# Patient Record
Sex: Male | Born: 1980 | Race: White | Hispanic: No | Marital: Single | Smoking: Never smoker
Health system: Southern US, Community
[De-identification: ages and names within clinical notes are randomized; demographics above are authoritative.]

## PROBLEM LIST (undated history)

## (undated) DIAGNOSIS — M479 Spondylosis, unspecified: Secondary | ICD-10-CM

## (undated) DIAGNOSIS — S348XXA Injury of other nerves at abdomen, lower back and pelvis level, initial encounter: Secondary | ICD-10-CM

## (undated) DIAGNOSIS — F329 Major depressive disorder, single episode, unspecified: Secondary | ICD-10-CM

## (undated) DIAGNOSIS — F32A Depression, unspecified: Secondary | ICD-10-CM

## (undated) DIAGNOSIS — Z4542 Encounter for adjustment and management of neuropacemaker (brain) (peripheral nerve) (spinal cord): Secondary | ICD-10-CM

## (undated) DIAGNOSIS — I10 Essential (primary) hypertension: Secondary | ICD-10-CM

## (undated) DIAGNOSIS — S349XXA Injury of unspecified nerves at abdomen, lower back and pelvis level, initial encounter: Secondary | ICD-10-CM

## (undated) HISTORY — PX: LUMBAR MICRODISCECTOMY: SHX99

---
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
## 2010-04-22 HISTORY — PX: SPINAL CORD STIMULATOR INSERTION: SHX5378

## 2014-12-19 ENCOUNTER — Other Ambulatory Visit (HOSPITAL_COMMUNITY): Payer: Self-pay | Admitting: Neurological Surgery

## 2014-12-19 DIAGNOSIS — M545 Low back pain, unspecified: Secondary | ICD-10-CM

## 2014-12-20 ENCOUNTER — Other Ambulatory Visit: Payer: Self-pay | Admitting: Neurological Surgery

## 2015-01-05 ENCOUNTER — Ambulatory Visit (HOSPITAL_COMMUNITY)
Admission: RE | Admit: 2015-01-05 | Discharge: 2015-01-05 | Disposition: A | Discharge status: Home / Self Care | Payer: Managed Care, Other (non HMO) | Source: Ambulatory Visit | Attending: Neurological Surgery | Admitting: Neurological Surgery

## 2015-01-05 DIAGNOSIS — M4806 Spinal stenosis, lumbar region: Secondary | ICD-10-CM | POA: Insufficient documentation

## 2015-01-05 DIAGNOSIS — M47896 Other spondylosis, lumbar region: Secondary | ICD-10-CM | POA: Diagnosis not present

## 2015-01-05 DIAGNOSIS — M545 Low back pain, unspecified: Secondary | ICD-10-CM

## 2015-01-05 DIAGNOSIS — M79661 Pain in right lower leg: Secondary | ICD-10-CM | POA: Diagnosis not present

## 2015-01-05 DIAGNOSIS — R200 Anesthesia of skin: Secondary | ICD-10-CM | POA: Insufficient documentation

## 2015-01-05 MED ORDER — ONDANSETRON HCL 4 MG/2ML IJ SOLN: 4.0000 mg | Freq: Four times a day (QID) | INTRAMUSCULAR | Status: DC | PRN

## 2015-01-05 MED ORDER — IOHEXOL 180 MG/ML  SOLN
20.0000 mL | Freq: Once | INTRAMUSCULAR | Status: DC | PRN
  Administered 2015-01-05: 16 mL via INTRATHECAL
  Filled 2015-01-05: qty 20

## 2015-01-05 MED ORDER — DIAZEPAM 5 MG PO TABS
10.0000 mg | ORAL_TABLET | Freq: Once | ORAL | Status: AC
  Administered 2015-01-05: 10 mg via ORAL

## 2015-01-05 MED ORDER — DIAZEPAM 5 MG PO TABS
ORAL_TABLET | ORAL | Status: AC
  Filled 2015-01-05: qty 2

## 2015-01-05 MED ORDER — OXYCODONE-ACETAMINOPHEN 5-325 MG PO TABS
1.0000 | ORAL_TABLET | ORAL | Status: DC | PRN
  Administered 2015-01-05: 2 via ORAL
  Filled 2015-01-05 (×2): qty 2

## 2015-01-05 MED ORDER — LIDOCAINE HCL (PF) 1 % IJ SOLN
INTRAMUSCULAR | Status: AC
  Filled 2015-01-05: qty 10

## 2015-01-05 NOTE — Discharge Instructions (Signed)
Myelography, Care After °These instructions give you information on caring for yourself after your procedure. Your doctor may also give you specific instructions. Call your doctor if you have any problems or questions after your procedure. °HOME CARE °· Rest often the first day. °· When you rest, lie flat, with your head slightly raised (elevated). °· Avoid heavy lifting and activity for 48 hours. °· You may take the bandage (dressing) off 1 day after the test. °GET HELP RIGHT AWAY IF:  °· You have a very bad headache. °· You have a fever. °MAKE SURE YOU: °· Understand these instructions. °· Will watch your condition. °· Will get help right away if you are not doing well or get worse. °Document Released: 01/16/2008 Document Revised: 08/23/2013 Document Reviewed: 07/28/2013 °Exit[employer]® Patient Information ©2015 [employer]. This information is not intended to replace advice given to you by your health care provider. Make sure you discuss any questions you have with your health care provider. ° ° °

## 2015-01-05 NOTE — Procedures (Signed)
Mr. for cup check is a 34 year old individual is had several surgeries in the past. He had implantation of spinal cord stimulator elsewhere and has had persistent and worsening pain in his back and down his legs he notes that in the last month he's developed a new pain in the left anterior thigh but he said chronic right lumbar radiculopathy for long period of time because of the presence of the spinal cord stimulator a myelogram is now being performed to further evaluate this process.  Pre op Dx: Lumbar radiculopathy Post op Dx: Lumbar radiculopathy Procedure: Lumbar myelogram Surgeon: Tinna Kolker Puncture level: L2-3 Fluid color: Clear colorless Injection:partially subdural injection with mixed features. Advanced spondylitic changes at L3-4 L4-5 L5-S1. Further evaluation with myelography Findings: 16 mL iohexol 180, further evaluation with myelography

## 2016-10-16 IMAGING — CT CT L SPINE W/ CM
3 of 13 series · 8 of 33 positions shown, 9 images · non-contrast
Comparison: None

CLINICAL DATA: Low back pain that is throbbing. Right posterior
calf pain and lateral numbness. Left anterior thigh pain.

EXAM:
LUMBAR MYELOGRAM
FLUOROSCOPY TIME:  1 minutes 18 seconds
PROCEDURE:
Lumbar puncture and intrathecal contrast administration were
performed by Dr. Maloney who will separately report for the portion
of the procedure. I personally supervised acquisition of the
myelogram images.
TECHNIQUE: Contiguous axial images were obtained through the Lumbar spine after
the intrathecal infusion of infusion. Coronal and sagittal
reconstructions were obtained of the axial image sets.

[Series 4: l spine 2.0 i30s 3 · axial · 0.30mm/px · z∈[-293,-113]mm · 2 of 180 slices shown, 3 images]
[im 45/180  soft-tissue]
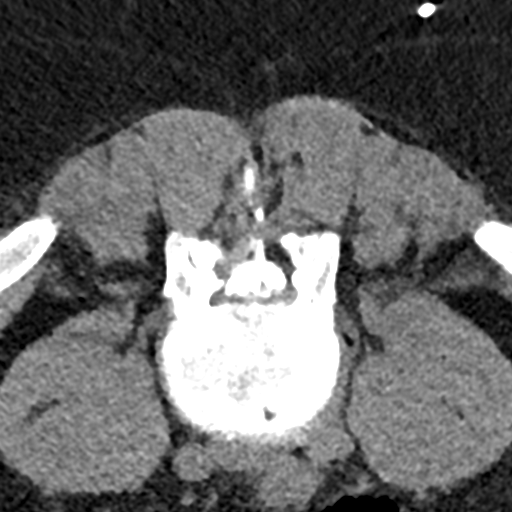
[im 45/180  bone]
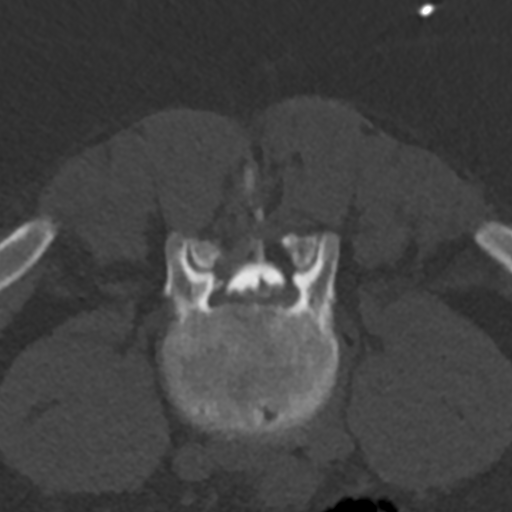
[im 135/180  bone]
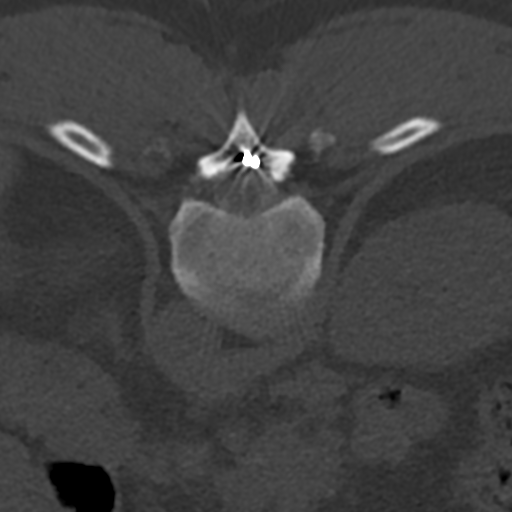

[Series 5: coronal bone · coronal · 0.27mm/px · 1 of 68 slices shown]
[im 34/68  bone]
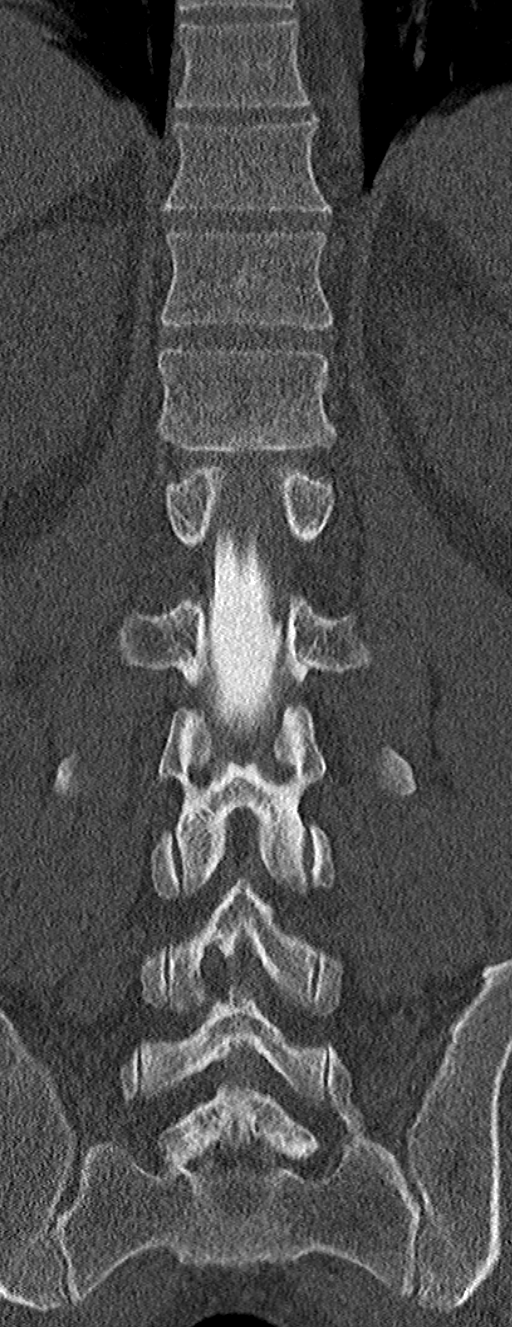

[Series 8: sagittal st · sagittal · 0.28mm/px · 5 of 61 slices shown]
[im 21/61  bone]
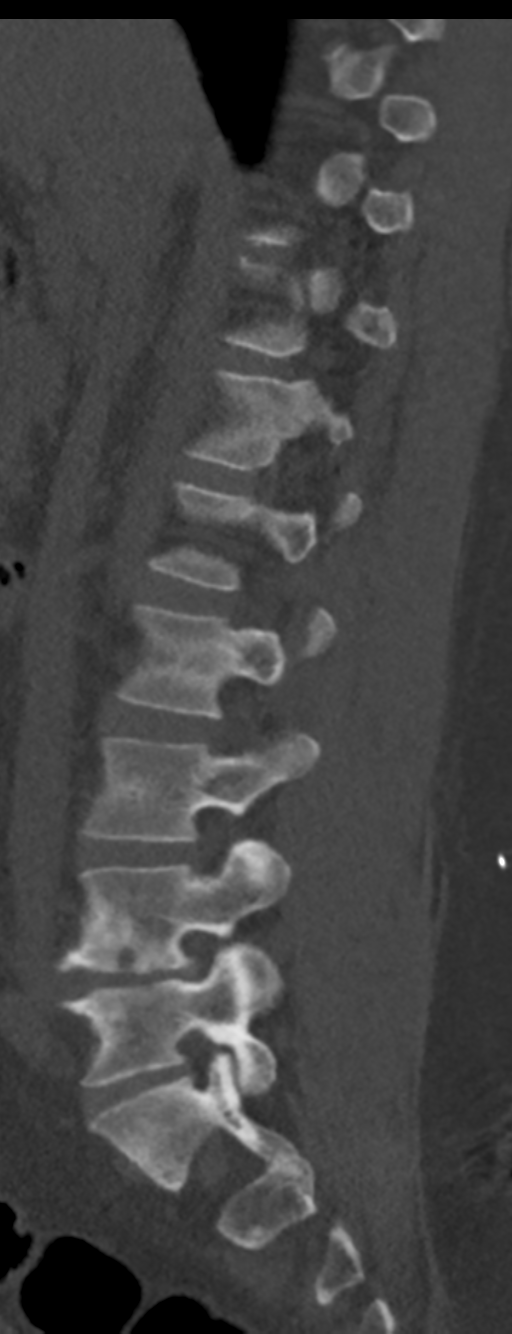
[im 26/61  bone]
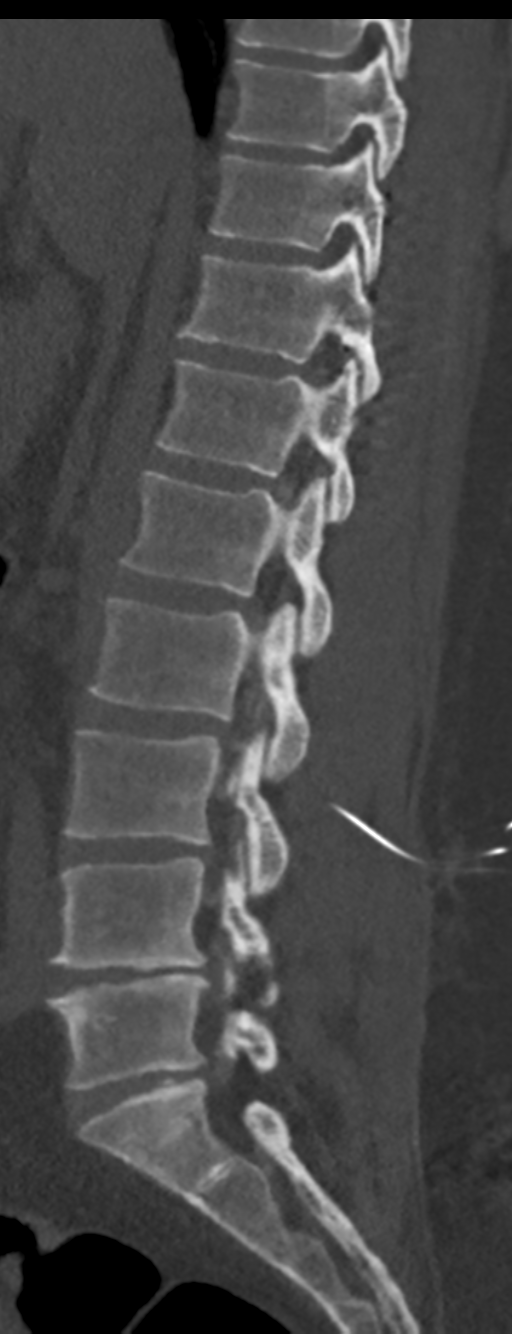
[im 31/61  bone]
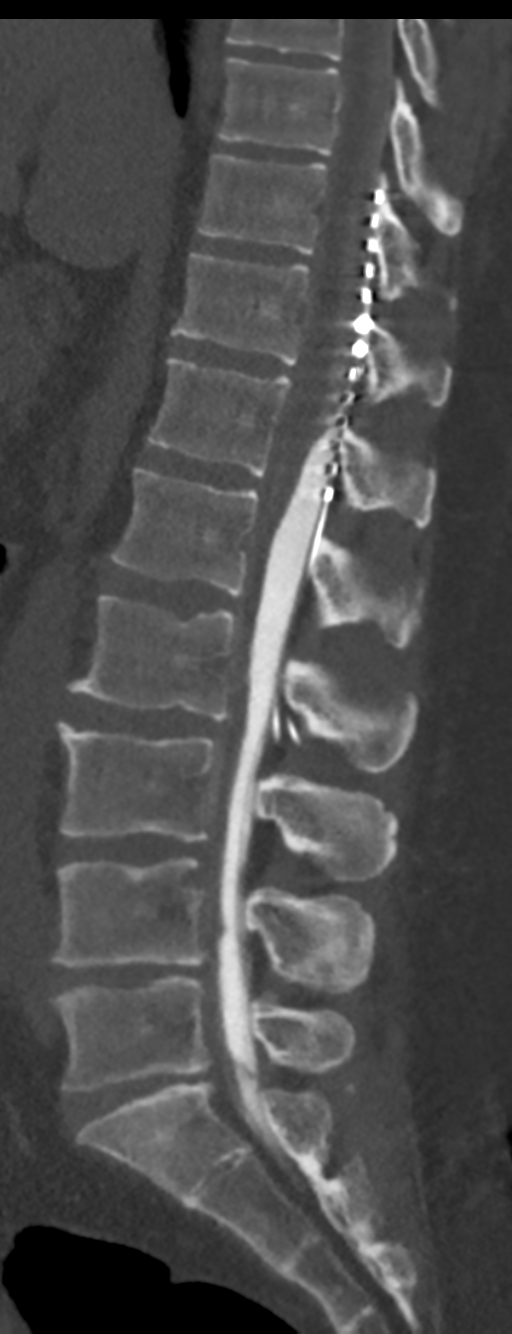
[im 36/61  bone]
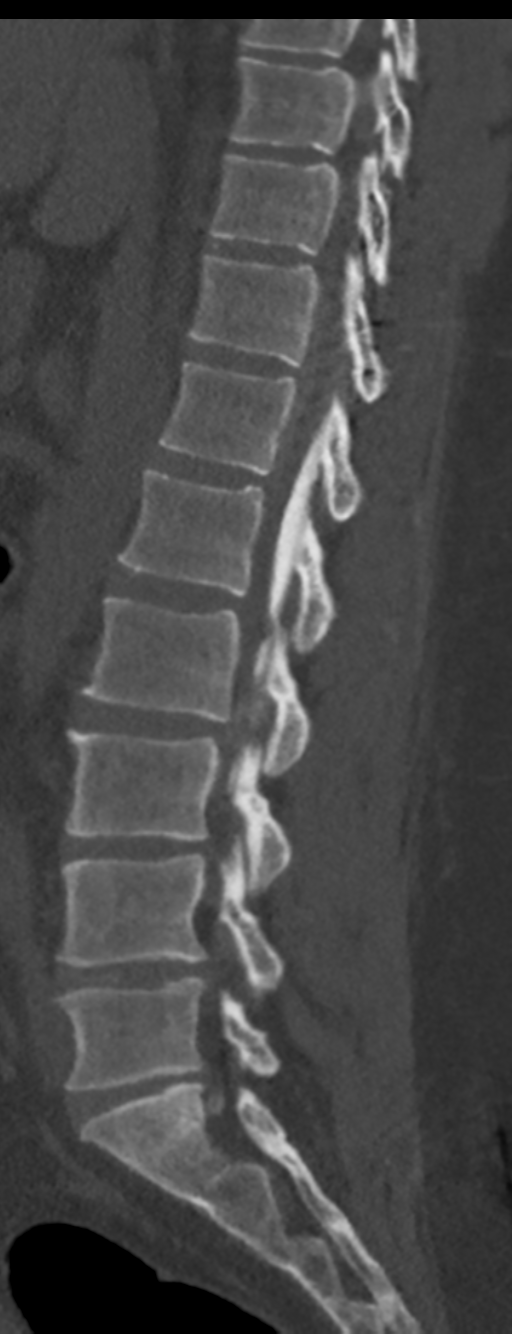
[im 41/61  bone]
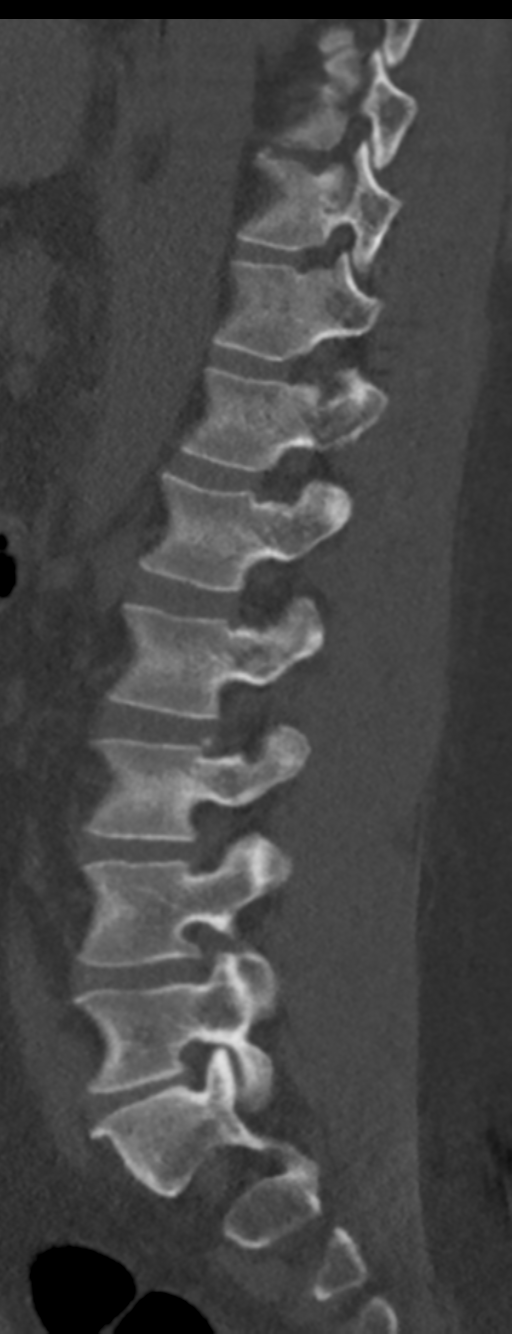

[8 of 33 positions shown; findings below may reference images not displayed]

FINDINGS: LUMBAR MYELOGRAM FINDINGS:

Mixed, predominantly subdural injection. The contrast bolus could
not be advanced any further cranially during myelogram.

There is a sacral stimulator which enters the spinal canal at the
L2-3 interspinous space and rises to the level of the T11 and T12
vertebrae (and higher on CT). No visible discontinuity.

Diffuse disc narrowing with spondylotic spurring, greatest at L4-5
and L5-S1. Thecal sac crowding mainly from the subdural fluid mass
effect, which improves on CT. No herniation seen. No listhesis or
abnormal motion.

CT LUMBAR MYELOGRAM FINDINGS:

Normal lumbar segmentation.

No fracture, evidence of infection, or focal bone lesion. No
retroperitoneal findings to explain symptoms. There is urinary
excretion of contrast, likely accelerated by subdural injection.

Degenerative changes:

Lower thoracic spine: Expected epidural placement of the spinal
stimulators without associated mass effect.

L1-L2: Minimal spondylotic spurring.

L2-L3: Mild disc narrowing and endplate spurring. Mild disc bulging.
No impingement noted.

L3-L4: Mild disc narrowing and bulging with small endplate spurs. No
impingement noted.

L4-L5: Moderate disc narrowing with endplate spurring and disc
bulging. Endplate spur contacts and displaces the left L4 nerve root
at the foraminal exit. No canal stenosis.

L5-S1:Moderate disc narrowing with mild endplate spurring and
bridging. No impingement.
IMPRESSION: LUMBAR MYELOGRAM IMPRESSION:

No listhesis or dynamic instability.

CT LUMBAR MYELOGRAM IMPRESSION:

1. Spondylosis with disc narrowing greatest at L4-5 and L5-S1.
2. L4-5 left endplate spur has mild mass effect on the L4 nerve root
at the foraminal exit. No other impingement identified.
3. Mixed injection, limiting nerve root evaluation.
4. Unremarkable lower thoracic spinal stimulators.

## 2016-10-25 ENCOUNTER — Ambulatory Visit
Admission: RE | Admit: 2016-10-25 | Discharge: 2016-10-25 | Disposition: A | Discharge status: Home / Self Care | Payer: 59 | Source: Ambulatory Visit | Attending: Family Medicine | Admitting: Family Medicine

## 2016-10-25 ENCOUNTER — Other Ambulatory Visit: Payer: Self-pay | Admitting: Family Medicine

## 2016-10-25 DIAGNOSIS — M25511 Pain in right shoulder: Secondary | ICD-10-CM

## 2017-04-22 HISTORY — PX: ROTATOR CUFF REPAIR: SHX139

## 2017-10-20 HISTORY — PX: SHOULDER SURGERY: SHX246

## 2019-06-09 ENCOUNTER — Other Ambulatory Visit: Payer: Self-pay | Admitting: Anesthesiology

## 2019-07-05 NOTE — Progress Notes (Signed)
[id number] - [place], [place] - [address] AT [place] [address] Phone: [phone] Fax: [phone]    Your procedure is scheduled on Friday, March 19th.  Report to [facility] Main Entrance "A" at 10:05 A.M., and check in at the Admitting office.  Call this number if you have problems the morning of surgery:  [phone]  Call [phone] if you have any questions prior to your surgery date Monday-Friday 8am-4pm   Remember:  Do not eat or drink after midnight the night before your surgery   Take these medicines the morning of surgery with A SIP OF WATER cetirizine (ZYRTEC)  citalopram (CELEXA)  If needed - cyclobenzaprine (FLEXERIL)   As of today, STOP taking any Aspirin (unless otherwise instructed by your surgeon), Aleve, Naproxen, Ibuprofen, Motrin, Advil, Goody's, BC's, all herbal medications, fish oil, and all vitamins.   The Morning of Surgery  Do not wear jewelry.  Do not wear lotions, powders, colognes, or deodorant  Men may shave face and neck.  Do not bring valuables to the hospital.  [facility] is not responsible for any belongings or valuables.  If you are a smoker, DO NOT Smoke 24 hours prior to surgery  If you wear a CPAP at night please bring your mask the morning of surgery   Remember that you must have someone to transport you home after your surgery, and remain with you for 24 hours if you are discharged the same day.  Please bring cases for contacts, glasses, hearing aids, dentures or bridgework because it cannot be worn into surgery.   Leave your suitcase in the car.  After surgery it may be brought to your room.  For patients admitted to the hospital, discharge time will be determined by your treatment team.  Patients discharged the day of surgery will not be allowed to drive home.   Special instructions:   [place]- Preparing For Surgery  Before surgery, you can play an  important role. Because skin is not sterile, your skin needs to be as free of germs as possible. You can reduce the number of germs on your skin by washing with CHG (chlorahexidine gluconate) Soap before surgery.  CHG is an antiseptic cleaner which kills germs and bonds with the skin to continue killing germs even after washing.    Oral Hygiene is also important to reduce your risk of infection.  Remember - BRUSH YOUR TEETH THE MORNING OF SURGERY WITH YOUR REGULAR TOOTHPASTE  Please do not use if you have an allergy to CHG or antibacterial soaps. If your skin becomes reddened/irritated stop using the CHG.  Do not shave (including legs and underarms) for at least 48 hours prior to first CHG shower. It is OK to shave your face.  Please follow these instructions carefully.   1. Shower the NIGHT BEFORE SURGERY and the MORNING OF SURGERY with CHG Soap.   2. If you chose to wash your hair, wash your hair first as usual with your normal shampoo.  3. After you shampoo, rinse your hair and body thoroughly to remove the shampoo.  4. Use CHG as you would any other liquid soap. You can apply CHG directly to the skin and wash gently with a scrungie or a clean washcloth.   5. Apply the CHG Soap to your body ONLY FROM THE NECK DOWN.  Do not use on open wounds or open sores. Avoid contact with your eyes, ears, mouth  and genitals (private parts). Wash Face and genitals (private parts)  with your normal soap.   6. Wash thoroughly, paying special attention to the area where your surgery will be performed.  7. Thoroughly rinse your body with warm water from the neck down.  8. DO NOT shower/wash with your normal soap after using and rinsing off the CHG Soap.  9. Pat yourself dry with a CLEAN TOWEL.  10. Wear CLEAN PAJAMAS to bed the night before surgery, wear comfortable clothes the morning of surgery  11. Place CLEAN SHEETS on your bed the night of your first shower and DO NOT SLEEP WITH PETS.  Day of  Surgery: Please shower the morning of surgery with the CHG soap Do not apply any deodorants/lotions. Please wear clean clothes to the hospital/surgery center.   Remember to brush your teeth WITH YOUR REGULAR TOOTHPASTE.  Please read over the following fact sheets that you were given.

## 2019-07-06 ENCOUNTER — Other Ambulatory Visit: Payer: Self-pay

## 2019-07-06 ENCOUNTER — Encounter (HOSPITAL_COMMUNITY)
Admission: RE | Admit: 2019-07-06 | Discharge: 2019-07-06 | Disposition: A | Discharge status: Home / Self Care | Payer: BC Managed Care – PPO | Source: Ambulatory Visit | Attending: Anesthesiology | Admitting: Anesthesiology

## 2019-07-06 ENCOUNTER — Encounter (HOSPITAL_COMMUNITY): Payer: Self-pay

## 2019-07-06 ENCOUNTER — Other Ambulatory Visit (HOSPITAL_COMMUNITY)
Admission: RE | Admit: 2019-07-06 | Discharge: 2019-07-06 | Disposition: A | Discharge status: Home / Self Care | Payer: BC Managed Care – PPO | Source: Ambulatory Visit | Attending: Anesthesiology | Admitting: Anesthesiology

## 2019-07-06 DIAGNOSIS — Z20822 Contact with and (suspected) exposure to covid-19: Secondary | ICD-10-CM | POA: Diagnosis not present

## 2019-07-06 DIAGNOSIS — Z01812 Encounter for preprocedural laboratory examination: Secondary | ICD-10-CM | POA: Diagnosis not present

## 2019-07-06 DIAGNOSIS — Z01810 Encounter for preprocedural cardiovascular examination: Secondary | ICD-10-CM | POA: Diagnosis not present

## 2019-07-06 DIAGNOSIS — I4510 Unspecified right bundle-branch block: Secondary | ICD-10-CM | POA: Insufficient documentation

## 2019-07-06 HISTORY — DX: Spondylosis, unspecified: M47.9

## 2019-07-06 HISTORY — DX: Injury of other nerves at abdomen, lower back and pelvis level, initial encounter: S34.8XXA

## 2019-07-06 HISTORY — DX: Essential (primary) hypertension: I10

## 2019-07-06 HISTORY — DX: Depression, unspecified: F32.A

## 2019-07-06 HISTORY — DX: Encounter for adjustment and management of neurostimulator: Z45.42

## 2019-07-06 HISTORY — DX: Injury of unspecified nerves at abdomen, lower back and pelvis level, initial encounter: S34.9XXA

## 2019-07-06 LAB — BASIC METABOLIC PANEL
Anion gap: 10 (ref 5–15)
BUN: 11 mg/dL (ref 6–20)
CO2: 27 mmol/L (ref 22–32)
Calcium: 9.3 mg/dL (ref 8.9–10.3)
Chloride: 102 mmol/L (ref 98–111)
Creatinine, Ser: 0.99 mg/dL (ref 0.61–1.24)
GFR calc Af Amer: >60 mL/min (ref >60)
GFR calc non Af Amer: >60 mL/min (ref >60)
Glucose, Bld: 102 mg/dL — ABNORMAL HIGH (ref 70–99)
Potassium: 4.4 mmol/L (ref 3.5–5.1)
Sodium: 139 mmol/L (ref 135–145)

## 2019-07-06 LAB — CBC
HCT: 45.2 % (ref 39.0–52.0)
Hemoglobin: 15 g/dL (ref 13.0–17.0)
MCH: 29.4 pg (ref 26.0–34.0)
MCHC: 33.2 g/dL (ref 30.0–36.0)
MCV: 88.5 fL (ref 80.0–100.0)
Platelets: 297 10*3/uL (ref 150–400)
RBC: 5.11 MIL/uL (ref 4.22–5.81)
RDW: 12.1 % (ref 11.5–15.5)
WBC: 6.8 10*3/uL (ref 4.0–10.5)
nRBC: 0 % (ref 0.0–0.2)

## 2019-07-06 LAB — SARS CORONAVIRUS 2 (TAT 6-24 HRS): SARS Coronavirus 2: NEGATIVE

## 2019-07-06 LAB — SURGICAL PCR SCREEN
MRSA, PCR: NEGATIVE
Staphylococcus aureus: POSITIVE — AB

## 2019-07-06 NOTE — Progress Notes (Signed)
PCP - Dr. Lupe Carney Cardiologist - denies  PPM/ICD - denies  Chest x-ray - N/A EKG - 07/06/2019 Stress Test - denies ECHO - denies Cardiac Cath - denies  Sleep Study - denies CPAP - N/A  Blood Thinner Instructions: N/A Aspirin Instructions: N/A  ERAS Protcol - No  COVID TEST- Scheduled for today 07/06/2019 after PAT appointment. Patient verbalized understanding of self-quarantine instructions.  Anesthesia review: YES, records requested from PCP's office  Patient denies shortness of breath, fever, cough and chest pain at PAT appointment  All instructions explained to the patient, with a verbal understanding of the material. Patient agrees to go over the instructions while at home for a better understanding. Patient also instructed to self quarantine after being tested for COVID-19. The opportunity to ask questions was provided.

## 2019-07-08 MED ORDER — CEFAZOLIN SODIUM 10 G IJ SOLR
3.0000 g | INTRAMUSCULAR | Status: AC
  Administered 2019-07-09: 3 g via INTRAVENOUS
  Filled 2019-07-08: qty 3
  Filled 2019-07-08: qty 3000

## 2019-07-09 ENCOUNTER — Ambulatory Visit (HOSPITAL_COMMUNITY): Payer: BC Managed Care – PPO | Admitting: Certified Registered"

## 2019-07-09 ENCOUNTER — Other Ambulatory Visit: Payer: Self-pay

## 2019-07-09 ENCOUNTER — Ambulatory Visit (HOSPITAL_COMMUNITY)
Admission: RE | Admit: 2019-07-09 | Discharge: 2019-07-09 | Disposition: A | Discharge status: Home / Self Care | Payer: BC Managed Care – PPO | Attending: Anesthesiology | Admitting: Anesthesiology

## 2019-07-09 ENCOUNTER — Ambulatory Visit (HOSPITAL_COMMUNITY): Payer: BC Managed Care – PPO | Admitting: Emergency Medicine

## 2019-07-09 ENCOUNTER — Encounter (HOSPITAL_COMMUNITY)
Admission: RE | Disposition: A | Discharge status: Home / Self Care | Payer: Self-pay | Source: Home / Self Care | Attending: Anesthesiology

## 2019-07-09 ENCOUNTER — Encounter (HOSPITAL_COMMUNITY): Payer: Self-pay | Admitting: Anesthesiology

## 2019-07-09 DIAGNOSIS — I10 Essential (primary) hypertension: Secondary | ICD-10-CM | POA: Diagnosis not present

## 2019-07-09 DIAGNOSIS — G894 Chronic pain syndrome: Secondary | ICD-10-CM | POA: Diagnosis not present

## 2019-07-09 DIAGNOSIS — M5410 Radiculopathy, site unspecified: Secondary | ICD-10-CM | POA: Diagnosis not present

## 2019-07-09 DIAGNOSIS — Z79899 Other long term (current) drug therapy: Secondary | ICD-10-CM | POA: Diagnosis not present

## 2019-07-09 DIAGNOSIS — M961 Postlaminectomy syndrome, not elsewhere classified: Secondary | ICD-10-CM | POA: Diagnosis not present

## 2019-07-09 HISTORY — PX: SPINAL CORD STIMULATOR BATTERY EXCHANGE: SHX6202

## 2019-07-09 SURGERY — SPINAL CORD STIMULATOR BATTERY EXCHANGE
Anesthesia: General

## 2019-07-09 MED ORDER — PHENYLEPHRINE 40 MCG/ML (10ML) SYRINGE FOR IV PUSH (FOR BLOOD PRESSURE SUPPORT)
PREFILLED_SYRINGE | INTRAVENOUS | Status: AC
  Filled 2019-07-09: qty 10

## 2019-07-09 MED ORDER — 0.9 % SODIUM CHLORIDE (POUR BTL) OPTIME
TOPICAL | Status: DC | PRN
  Administered 2019-07-09: 1000 mL

## 2019-07-09 MED ORDER — LIDOCAINE 2% (20 MG/ML) 5 ML SYRINGE
INTRAMUSCULAR | Status: DC | PRN
  Administered 2019-07-09: 80 mg via INTRAVENOUS

## 2019-07-09 MED ORDER — ROCURONIUM BROMIDE 10 MG/ML (PF) SYRINGE
PREFILLED_SYRINGE | INTRAVENOUS | Status: AC
  Filled 2019-07-09: qty 10

## 2019-07-09 MED ORDER — PROMETHAZINE HCL 25 MG/ML IJ SOLN: 6.2500 mg | INTRAMUSCULAR | Status: DC | PRN

## 2019-07-09 MED ORDER — LIDOCAINE-EPINEPHRINE 1 %-1:100000 IJ SOLN
INTRAMUSCULAR | Status: DC | PRN
  Administered 2019-07-09: 10 mL

## 2019-07-09 MED ORDER — CHLORHEXIDINE GLUCONATE CLOTH 2 % EX PADS: 6.0000 | MEDICATED_PAD | Freq: Once | CUTANEOUS | Status: DC

## 2019-07-09 MED ORDER — ONDANSETRON HCL 4 MG/2ML IJ SOLN
INTRAMUSCULAR | Status: DC | PRN
  Administered 2019-07-09: 4 mg via INTRAVENOUS

## 2019-07-09 MED ORDER — PROPOFOL 10 MG/ML IV BOLUS
INTRAVENOUS | Status: AC
  Filled 2019-07-09: qty 20

## 2019-07-09 MED ORDER — MIDAZOLAM HCL 5 MG/5ML IJ SOLN
INTRAMUSCULAR | Status: DC | PRN
  Administered 2019-07-09: 2 mg via INTRAVENOUS

## 2019-07-09 MED ORDER — LIDOCAINE-EPINEPHRINE 1 %-1:100000 IJ SOLN
INTRAMUSCULAR | Status: AC
  Filled 2019-07-09: qty 1

## 2019-07-09 MED ORDER — DEXAMETHASONE SODIUM PHOSPHATE 10 MG/ML IJ SOLN
INTRAMUSCULAR | Status: AC
  Filled 2019-07-09: qty 1

## 2019-07-09 MED ORDER — MIDAZOLAM HCL 2 MG/2ML IJ SOLN
INTRAMUSCULAR | Status: AC
  Filled 2019-07-09: qty 2

## 2019-07-09 MED ORDER — HYDROMORPHONE HCL 1 MG/ML IJ SOLN: 0.2500 mg | INTRAMUSCULAR | Status: DC | PRN

## 2019-07-09 MED ORDER — PROPOFOL 10 MG/ML IV BOLUS
INTRAVENOUS | Status: DC | PRN
  Administered 2019-07-09: 60 mg via INTRAVENOUS
  Administered 2019-07-09: 200 mg via INTRAVENOUS

## 2019-07-09 MED ORDER — FENTANYL CITRATE (PF) 250 MCG/5ML IJ SOLN
INTRAMUSCULAR | Status: AC
  Filled 2019-07-09: qty 5

## 2019-07-09 MED ORDER — LACTATED RINGERS IV SOLN: INTRAVENOUS | Status: DC

## 2019-07-09 MED ORDER — OXYCODONE HCL 5 MG/5ML PO SOLN: 5.0000 mg | Freq: Once | ORAL | Status: DC | PRN

## 2019-07-09 MED ORDER — FENTANYL CITRATE (PF) 100 MCG/2ML IJ SOLN
INTRAMUSCULAR | Status: DC | PRN
  Administered 2019-07-09: 50 ug via INTRAVENOUS
  Administered 2019-07-09: 150 ug via INTRAVENOUS

## 2019-07-09 MED ORDER — OXYCODONE-ACETAMINOPHEN 7.5-325 MG PO TABS: 1.0000 | ORAL_TABLET | ORAL | 0 refills | Status: AC | PRN

## 2019-07-09 MED ORDER — ACETAMINOPHEN 500 MG PO TABS
1000.0000 mg | ORAL_TABLET | Freq: Once | ORAL | Status: AC
  Administered 2019-07-09: 1000 mg via ORAL
  Filled 2019-07-09: qty 2

## 2019-07-09 MED ORDER — OXYCODONE HCL 5 MG PO TABS: 5.0000 mg | ORAL_TABLET | Freq: Once | ORAL | Status: DC | PRN

## 2019-07-09 MED ORDER — GLYCOPYRROLATE PF 0.2 MG/ML IJ SOSY
PREFILLED_SYRINGE | INTRAMUSCULAR | Status: DC | PRN
  Administered 2019-07-09: .1 mg via INTRAVENOUS

## 2019-07-09 MED ORDER — ONDANSETRON HCL 4 MG/2ML IJ SOLN
INTRAMUSCULAR | Status: AC
  Filled 2019-07-09: qty 2

## 2019-07-09 MED ORDER — LIDOCAINE 2% (20 MG/ML) 5 ML SYRINGE
INTRAMUSCULAR | Status: AC
  Filled 2019-07-09: qty 5

## 2019-07-09 MED ORDER — SUCCINYLCHOLINE CHLORIDE 200 MG/10ML IV SOSY
PREFILLED_SYRINGE | INTRAVENOUS | Status: DC | PRN
  Administered 2019-07-09: 140 mg via INTRAVENOUS

## 2019-07-09 MED ORDER — LACTATED RINGERS IV SOLN: INTRAVENOUS | Status: DC | PRN

## 2019-07-09 MED ORDER — GLYCOPYRROLATE PF 0.2 MG/ML IJ SOSY
PREFILLED_SYRINGE | INTRAMUSCULAR | Status: AC
  Filled 2019-07-09: qty 1

## 2019-07-09 MED ORDER — DEXAMETHASONE SODIUM PHOSPHATE 10 MG/ML IJ SOLN
INTRAMUSCULAR | Status: DC | PRN
  Administered 2019-07-09: 10 mg via INTRAVENOUS

## 2019-07-09 MED ORDER — KETOROLAC TROMETHAMINE 30 MG/ML IJ SOLN: 30.0000 mg | Freq: Once | INTRAMUSCULAR | Status: DC | PRN

## 2019-07-09 SURGICAL SUPPLY — 36 items
BINDER ABDOMINAL 12 ML 46-62 (SOFTGOODS) ×2 IMPLANT
BLADE CLIPPER SURG (BLADE) ×1 IMPLANT
CHLORAPREP W/TINT 26 (MISCELLANEOUS) ×2 IMPLANT
COVER WAND RF STERILE (DRAPES) ×2 IMPLANT
DERMABOND ADVANCED (GAUZE/BANDAGES/DRESSINGS) ×1
DERMABOND ADVANCED .7 DNX12 (GAUZE/BANDAGES/DRESSINGS) ×1 IMPLANT
DEVICE IMPLANT NEUROSTIMULATOR (Neuro Prosthesis/Implant) ×1 IMPLANT
DRAPE LAPAROTOMY 100X72X124 (DRAPES) ×2 IMPLANT
DRSG OPSITE POSTOP 4X6 (GAUZE/BANDAGES/DRESSINGS) ×1 IMPLANT
ENVELOPE ABSORB ANTIBACTERIAL (Neuro Prosthesis/Implant) ×2 IMPLANT
GLOVE BIOGEL PI IND STRL 7.5 (GLOVE) ×1 IMPLANT
GLOVE BIOGEL PI INDICATOR 7.5 (GLOVE) ×1
GLOVE ECLIPSE 7.5 STRL STRAW (GLOVE) ×2 IMPLANT
GLOVE EXAM NITRILE LRG STRL (GLOVE) IMPLANT
GLOVE EXAM NITRILE XL STR (GLOVE) IMPLANT
GLOVE EXAM NITRILE XS STR PU (GLOVE) IMPLANT
GOWN STRL REUS W/ TWL LRG LVL3 (GOWN DISPOSABLE) IMPLANT
GOWN STRL REUS W/TWL LRG LVL3 (GOWN DISPOSABLE)
KIT BASIN OR (CUSTOM PROCEDURE TRAY) ×2 IMPLANT
KIT TURNOVER KIT B (KITS) ×2 IMPLANT
NDL HYPO 25X1 1.5 SAFETY (NEEDLE) ×1 IMPLANT
NEEDLE HYPO 25X1 1.5 SAFETY (NEEDLE) ×2 IMPLANT
PACK LAMINECTOMY NEURO (CUSTOM PROCEDURE TRAY) ×2 IMPLANT
PAD ARMBOARD 7.5X6 YLW CONV (MISCELLANEOUS) ×2 IMPLANT
POUCH TYRX ANTIBAC NEURO MED (Neuro Prosthesis/Implant) IMPLANT
RECHARGER INTELLIS (NEUROSURGERY SUPPLIES) ×1 IMPLANT
REPROGRAMMER INTELLIS (NEUROSURGERY SUPPLIES) ×1 IMPLANT
SPONGE LAP 4X18 RFD (DISPOSABLE) IMPLANT
SUT MNCRL AB 4-0 PS2 18 (SUTURE) ×2 IMPLANT
SUT SILK 2 0 PERMA HAND 18 BK (SUTURE) IMPLANT
SUT VIC AB 2-0 CP2 18 (SUTURE) ×2 IMPLANT
SYR 10ML LL (SYRINGE) IMPLANT
SYR EPIDURAL 5ML GLASS (SYRINGE) IMPLANT
TOWEL GREEN STERILE (TOWEL DISPOSABLE) ×2 IMPLANT
TOWEL GREEN STERILE FF (TOWEL DISPOSABLE) ×2 IMPLANT
WATER STERILE IRR 1000ML POUR (IV SOLUTION) ×2 IMPLANT

## 2019-07-09 NOTE — Anesthesia Preprocedure Evaluation (Addendum)
Anesthesia Evaluation  Patient identified by MRN, date of birth, ID band Patient awake    Reviewed: Allergy & Precautions, NPO status , Patient's Chart, lab work & pertinent test results  Airway Mallampati: II  TM Distance: >3 FB Neck ROM: Full    Dental no notable dental hx.    Pulmonary neg pulmonary ROS,    Pulmonary exam normal breath sounds clear to auscultation       Cardiovascular hypertension, Pt. on medications Normal cardiovascular exam Rhythm:Regular Rate:Normal  ECG: rate 74. Normal sinus rhythm Right bundle branch block   Neuro/Psych PSYCHIATRIC DISORDERS Depression Right sided Bells Palsy    GI/Hepatic negative GI ROS, Neg liver ROS,   Endo/Other  Morbid obesity  Renal/GU negative Renal ROS     Musculoskeletal negative musculoskeletal ROS (+)   Abdominal (+) + obese,   Peds  Hematology negative hematology ROS (+)   Anesthesia Other Findings Battery end of life of spinal cord stimulator  Reproductive/Obstetrics                            Anesthesia Physical Anesthesia Plan  ASA: III  Anesthesia Plan: General   Post-op Pain Management:    Induction: Intravenous  PONV Risk Score and Plan: 2 and Midazolam, Dexamethasone, Ondansetron and Treatment may vary due to age or medical condition  Airway Management Planned: Oral ETT  Additional Equipment:   Intra-op Plan:   Post-operative Plan: Extubation in OR  Informed Consent: I have reviewed the patients History and Physical, chart, labs and discussed the procedure including the risks, benefits and alternatives for the proposed anesthesia with the patient or authorized representative who has indicated his/her understanding and acceptance.     Dental advisory given  Plan Discussed with: CRNA  Anesthesia Plan Comments:        Anesthesia Quick Evaluation

## 2019-07-09 NOTE — Anesthesia Postprocedure Evaluation (Signed)
Anesthesia Post Note  Patient: Jeferson Boozer  Procedure(s) Performed: Spinal cord stimulator implantable pulse generator replacement (N/A )     Patient location during evaluation: PACU Anesthesia Type: General Level of consciousness: awake and alert Pain management: pain level controlled Vital Signs Assessment: post-procedure vital signs reviewed and stable Respiratory status: spontaneous breathing, nonlabored ventilation, respiratory function stable and patient connected to nasal cannula oxygen Cardiovascular status: blood pressure returned to baseline and stable Postop Assessment: no apparent nausea or vomiting Anesthetic complications: no    Last Vitals:  Vitals:   07/09/19 1309 07/09/19 1324  BP: 131/77 123/83  Pulse: 79 73  Resp: 19 18  Temp:    SpO2: 93% 97%    Last Pain:  Vitals:   07/09/19 1324  TempSrc:   PainSc: 0-No pain                 Nayanna Seaborn P Amiliana Foutz

## 2019-07-09 NOTE — H&P (Signed)
Jeffrey Werner is an 39 y.o. male.   Chief Complaint: back pain, radiculopathy HPI: extremely pleasant gentleman with remote hx of disc herniation, discectomy but residual back pain and radiculopathy. Had a Medtronic SCS placed years ago, but recently got warnings that the battery charge was low and at the end of its life. He now presents for IPG replacement. He is aware of the risks and benefits, including problems with leads corroding and breaking down or breaking as we try to remove them from his old battery.   Past Medical History:  Diagnosis Date  . Arthritis of back   . Battery end of life of spinal cord stimulator   . Depression   . Hypertension   . Lower back nerve damage     Past Surgical History:  Procedure Laterality Date  . LUMBAR MICRODISCECTOMY     X2. 2001 and in 2008  . ROTATOR CUFF REPAIR Right 2019  . SHOULDER SURGERY Right 10/2017  . SPINAL CORD STIMULATOR INSERTION Right 2012    History reviewed. No pertinent family history. Social History:  reports that he has never smoked. He has never used smokeless tobacco. He reports current alcohol use. He reports that he does not use drugs.  Allergies: No Known Allergies  Medications Prior to Admission  Medication Sig Dispense Refill  . cetirizine (ZYRTEC) 10 MG tablet Take 10 mg by mouth daily.    . citalopram (CELEXA) 20 MG tablet Take 20 mg by mouth daily.    Marland Kitchen lisdexamfetamine (VYVANSE) 60 MG capsule Take 60 mg by mouth daily.     Marland Kitchen lisinopril-hydrochlorothiazide (PRINZIDE,ZESTORETIC) 20-12.5 MG per tablet Take 1 tablet by mouth daily.    . Multiple Vitamins-Minerals (MULTIVITAMIN PO) Take 1 tablet by mouth daily.    . vitamin B-12 (CYANOCOBALAMIN) 1000 MCG tablet Take 1,000 mcg by mouth daily.    . cyclobenzaprine (FLEXERIL) 10 MG tablet Take 10 mg by mouth daily as needed for muscle spasms.    Marland Kitchen dextroamphetamine (DEXTROSTAT) 5 MG tablet Take 5 mg by mouth daily as needed (ADHD Booster).     . sucralfate  (CARAFATE) 1 G tablet Take 1 g by mouth 3 (three) times daily as needed (for stomach).      No results found for this or any previous visit (from the past 48 hour(s)). No results found.  Review of Systems  Constitutional: Negative.   HENT: Negative.   Eyes: Negative.   Respiratory: Negative.   Cardiovascular: Negative.   Gastrointestinal: Negative.   Endocrine: Negative.   Genitourinary: Negative.   Musculoskeletal: Positive for back pain. Negative for arthralgias, gait problem, joint swelling, myalgias, neck pain and neck stiffness.  Skin: Negative.   Allergic/Immunologic: Negative.   Neurological: Negative.   Hematological: Negative.   Psychiatric/Behavioral: Negative.     Blood pressure (!) 166/94, pulse 89, temperature 98.2 F (36.8 C), temperature source Oral, resp. rate 18, height 5\' 10"  (1.778 m), weight 135.6 kg, SpO2 98 %. Physical Exam  Constitutional: He is oriented to person, place, and time. He appears well-developed and well-nourished.  HENT:  Head: Normocephalic and atraumatic.  Eyes: Pupils are equal, round, and reactive to light. Conjunctivae and EOM are normal.  Cardiovascular: Normal rate.  Respiratory: Effort normal.  GI: Soft.  Musculoskeletal:        General: Normal range of motion.     Cervical back: Normal range of motion.  Neurological: He is alert and oriented to person, place, and time.  Skin: Skin is warm and dry.  Psychiatric: He has a normal mood and affect. His behavior is normal. Judgment and thought content normal.     Assessment/Plan Lumbar post-laminectomy syndrome; SCS with IPG at end of life  Gwynne Edinger, MD 07/09/2019, 11:46 AM

## 2019-07-09 NOTE — Op Note (Signed)
PREOP DX: 1) lumbar post-laminectomy syndrome; 2) chronic pain syndrome 3) SCS IPG (battery) at end of life. POSTOP OF:HQRF as preop PROCEDURES PERFORMED:1) replacement of Medtronic IPG SURGEON:Jaquasha Carnevale  ASSISTANT: NONE  ANESTHESIA:GETA EBL: <20cc  DESCRIPTION OF PROCEDURE: After a discussion of risks, benefits and alternatives, informed consent was obtained. The patient was taken to the OR,general anesthesia induced,turned prone onto a Jackson table, all pressure points padded, SCD's placed. A timeout was taken to verify the correct patient, position, personnel, availability of appropriate equipment, and administration of perioperative antibiotics.  The  lumbar area over the previously placed IPG was widely prepped with chloraprep and draped into a sterile field. A 3 cm incision was made with a 10 blade and sharp dissection used to access the old IPG. The pocket was trialed, and found to be of adequate size for a new IPG. The pocket was inspected for hemostasis, which was found to be excellent. The leads were removed from the old battery and placed into the new battery in the same lead orientation. Impedances were checked, found to be acceptable, and then the leads were fixed into the IPG with a self-torquing wrench. The wiring was all carefully coiled, placed behind the generator and placed in the pocket. An antibiotic-eluting mesh was placed in the pocket behind the IPG  The incision was copiously irrigated with bacitracin-containing irrigation. The incision was closed with a deeper layer of 2-0 vicryl interrupted sutures, and the skin closed a running 3-0 monocryl subcuticular suture and dermabond.Sterile dressings were applied.   Needle, sponge, and instrument counts were correct x2 at the end of the case.    The patient was then carefully awakened from anesthesia, turned supine, an abdominal binder placed, and the patient taken to the recovery room whereshe underwent complex  spinal cord stimulator programming.  COMPLICATIONS: NONE  CONDITION: Stable throughout the course of the procedure and immediately afterward  DISPOSITION:anticipatedischarge to home, with  pain medicine. Discussed care with the patientandfamily. Followup in clinic will be scheduled in 10-14 days.

## 2019-07-09 NOTE — Transfer of Care (Signed)
Immediate Anesthesia Transfer of Care Note  Patient: Jeffrey Werner  Procedure(s) Performed: Spinal cord stimulator implantable pulse generator replacement (N/A )  Patient Location: PACU  Anesthesia Type:General  Level of Consciousness: drowsy and patient cooperative  Airway & Oxygen Therapy: Patient Spontanous Breathing and Patient connected to nasal cannula oxygen  Post-op Assessment: Report given to RN and Post -op Vital signs reviewed and stable  Post vital signs: Reviewed and stable  Last Vitals:  Vitals Value Taken Time  BP 121/66 07/09/19 1254  Temp    Pulse 81 07/09/19 1256  Resp 18 07/09/19 1256  SpO2 95 % 07/09/19 1256  Vitals shown include unvalidated device data.  Last Pain:  Vitals:   07/09/19 1033  TempSrc:   PainSc: 2       Patients Stated Pain Goal: 4 (07/09/19 1033)  Complications: No apparent anesthesia complications

## 2019-07-09 NOTE — Anesthesia Procedure Notes (Signed)
Procedure Name: Intubation Date/Time: 07/09/2019 12:00 PM Performed by: Julian Reil, CRNA Pre-anesthesia Checklist: Patient identified, Emergency Drugs available, Suction available and Patient being monitored Patient Re-evaluated:Patient Re-evaluated prior to induction Oxygen Delivery Method: Circle system utilized Preoxygenation: Pre-oxygenation with 100% oxygen Induction Type: IV induction Laryngoscope Size: Miller and 3 Grade View: Grade II Tube type: Oral Tube size: 7.5 mm Number of attempts: 1 Airway Equipment and Method: Stylet Placement Confirmation: positive ETCO2,  ETT inserted through vocal cords under direct vision and breath sounds checked- equal and bilateral Secured at: 23 cm Tube secured with: Tape Dental Injury: Teeth and Oropharynx as per pre-operative assessment  Comments: 4x4s bite block used at end of proc.

## 2019-07-12 ENCOUNTER — Encounter: Payer: Self-pay | Admitting: *Deleted
